# Patient Record
Sex: Female | Born: 1975 | Race: Black or African American | Hispanic: No | Marital: Single | Smoking: Never smoker
Health system: Southern US, Community
[De-identification: ages and names within clinical notes are randomized; demographics above are authoritative.]

---

## 2000-09-10 ENCOUNTER — Emergency Department (HOSPITAL_COMMUNITY): Admission: EM | Admit: 2000-09-10 | Discharge: 2000-09-10 | Payer: Self-pay | Admitting: *Deleted

## 2000-09-11 ENCOUNTER — Ambulatory Visit (HOSPITAL_COMMUNITY): Admission: RE | Admit: 2000-09-11 | Discharge: 2000-09-11 | Payer: Self-pay | Admitting: *Deleted

## 2000-09-11 ENCOUNTER — Encounter: Payer: Self-pay | Admitting: *Deleted

## 2001-08-07 ENCOUNTER — Ambulatory Visit (HOSPITAL_COMMUNITY): Admission: RE | Admit: 2001-08-07 | Discharge: 2001-08-07 | Payer: Self-pay | Admitting: Pulmonary Disease

## 2001-08-13 ENCOUNTER — Ambulatory Visit (HOSPITAL_COMMUNITY): Admission: RE | Admit: 2001-08-13 | Discharge: 2001-08-13 | Payer: Self-pay | Admitting: Pulmonary Disease

## 2001-10-18 ENCOUNTER — Emergency Department (HOSPITAL_COMMUNITY): Admission: EM | Admit: 2001-10-18 | Discharge: 2001-10-18 | Payer: Self-pay | Admitting: Emergency Medicine

## 2002-03-19 ENCOUNTER — Encounter: Payer: Self-pay | Admitting: Emergency Medicine

## 2002-03-19 ENCOUNTER — Emergency Department (HOSPITAL_COMMUNITY): Admission: EM | Admit: 2002-03-19 | Discharge: 2002-03-19 | Payer: Self-pay | Admitting: Emergency Medicine

## 2003-05-10 ENCOUNTER — Emergency Department (HOSPITAL_COMMUNITY): Admission: EM | Admit: 2003-05-10 | Discharge: 2003-05-10 | Payer: Self-pay | Admitting: Emergency Medicine

## 2005-08-30 ENCOUNTER — Emergency Department (HOSPITAL_COMMUNITY): Admission: EM | Admit: 2005-08-30 | Discharge: 2005-08-30 | Payer: Self-pay | Admitting: Emergency Medicine

## 2007-12-10 ENCOUNTER — Other Ambulatory Visit: Admission: RE | Admit: 2007-12-10 | Discharge: 2007-12-10 | Payer: Self-pay | Admitting: Obstetrics and Gynecology

## 2009-06-02 ENCOUNTER — Other Ambulatory Visit: Admission: RE | Admit: 2009-06-02 | Discharge: 2009-06-02 | Payer: Self-pay | Admitting: Obstetrics & Gynecology

## 2013-02-21 ENCOUNTER — Emergency Department (HOSPITAL_COMMUNITY)
Admission: EM | Admit: 2013-02-21 | Discharge: 2013-02-21 | Disposition: A | Discharge status: Home / Self Care | Payer: Self-pay | Attending: Emergency Medicine | Admitting: Emergency Medicine

## 2013-02-21 ENCOUNTER — Encounter (HOSPITAL_COMMUNITY): Payer: Self-pay | Admitting: Emergency Medicine

## 2013-02-21 DIAGNOSIS — X58XXXA Exposure to other specified factors, initial encounter: Secondary | ICD-10-CM | POA: Insufficient documentation

## 2013-02-21 DIAGNOSIS — Y939 Activity, unspecified: Secondary | ICD-10-CM | POA: Insufficient documentation

## 2013-02-21 DIAGNOSIS — S39012A Strain of muscle, fascia and tendon of lower back, initial encounter: Secondary | ICD-10-CM

## 2013-02-21 DIAGNOSIS — Y929 Unspecified place or not applicable: Secondary | ICD-10-CM | POA: Insufficient documentation

## 2013-02-21 DIAGNOSIS — S335XXA Sprain of ligaments of lumbar spine, initial encounter: Secondary | ICD-10-CM | POA: Insufficient documentation

## 2013-02-21 MED ORDER — OXYCODONE-ACETAMINOPHEN 5-325 MG PO TABS: 1.0000 | ORAL_TABLET | ORAL | Status: DC | PRN

## 2013-02-21 MED ORDER — KETOROLAC TROMETHAMINE 30 MG/ML IJ SOLN: 30.0000 mg | Freq: Once | INTRAMUSCULAR | Status: DC

## 2013-02-21 MED ORDER — HYDROMORPHONE HCL PF 1 MG/ML IJ SOLN
1.0000 mg | Freq: Once | INTRAMUSCULAR | Status: AC
  Administered 2013-02-21: 1 mg via INTRAMUSCULAR
  Filled 2013-02-21: qty 1

## 2013-02-21 MED ORDER — DIAZEPAM 5 MG PO TABS: 5.0000 mg | ORAL_TABLET | Freq: Three times a day (TID) | ORAL | Status: DC | PRN

## 2013-02-21 MED ORDER — NAPROXEN 375 MG PO TABS: 375.0000 mg | ORAL_TABLET | Freq: Two times a day (BID) | ORAL | Status: DC | PRN

## 2013-02-21 MED ORDER — KETOROLAC TROMETHAMINE 30 MG/ML IJ SOLN
30.0000 mg | Freq: Once | INTRAMUSCULAR | Status: AC
  Administered 2013-02-21: 30 mg via INTRAMUSCULAR
  Filled 2013-02-21: qty 1

## 2013-02-21 MED ORDER — DIAZEPAM 5 MG PO TABS
5.0000 mg | ORAL_TABLET | Freq: Once | ORAL | Status: AC
  Administered 2013-02-21: 5 mg via ORAL
  Filled 2013-02-21: qty 1

## 2013-02-21 NOTE — ED Provider Notes (Signed)
CSN: [id number]     Arrival date & time 02/21/13  4098 History   This chart was scribed for Raeford Razor, MD by Bennett Scrape, ED Scribe. This patient was seen in room APA03/APA03 and the patient's care was started at 7:45 AM.   Chief Complaint  Patient presents with  . Back Pain    The history is provided by the patient. No language interpreter was used.    HPI Comments: Brittney Dixon is a 37 y.o. female who presents to the Emergency Department complaining of constant, gradually worsening lower back pain described as a stiffness for the past week. The pain was originally located more in the center but shifted to the right side last night. She also admits that she intermittently experiences radiation into the right upper thigh. She reports that the pain is worse with ambulation but denies any difficulty walking. She states that she has tried several OTC pain relievers including BC powders and ibuprofen with no improvement. She denies any h/o back pain or prior back surgeries. She denies any extremity numbness or weakness, fevers or chills.    History reviewed. No pertinent past medical history. History reviewed. No pertinent past surgical history. History reviewed. No pertinent family history. History  Substance Use Topics  . Smoking status: Never Smoker   . Smokeless tobacco: Not on file  . Alcohol Use: No   No OB history provided.  Review of Systems  Constitutional: Negative for fever and chills.  Musculoskeletal: Positive for back pain. Negative for gait problem.  Neurological: Negative for weakness and numbness.  All other systems reviewed and are negative.    Allergies  Review of patient's allergies indicates not on file.  Home Medications  No current outpatient prescriptions on file.  Triage Vitals: BP 121/90  Pulse 92  Temp(Src) 98.5 F (36.9 C) (Oral)  Ht 5\' 2"  (1.575 m)  Wt 234 lb (106.142 kg)  BMI 42.79 kg/m2  SpO2 100%  LMP 02/22/2012  Physical Exam   Nursing note and vitals reviewed. Constitutional: She is oriented to person, place, and time. She appears well-developed and well-nourished. No distress.  HENT:  Head: Normocephalic and atraumatic.  Eyes: EOM are normal.  Neck: Normal range of motion.  Cardiovascular: Normal rate, regular rhythm and normal heart sounds.   Pulmonary/Chest: Effort normal and breath sounds normal.  Abdominal: Soft. She exhibits no distension. There is no tenderness.  Musculoskeletal: Normal range of motion.  Tenderness in the lumbar midline region and right paraspinal muscles  Neurological: She is alert and oriented to person, place, and time.  Normal strength to BLE  Skin: Skin is warm and dry.  Psychiatric: She has a normal mood and affect. Judgment normal.    ED Course  Procedures (including critical care time)  Medications  HYDROmorphone (DILAUDID) injection 1 mg (1 mg Intramuscular Given 02/21/13 0803)  diazepam (VALIUM) tablet 5 mg (5 mg Oral Given 02/21/13 0803)  ketorolac (TORADOL) 30 MG/ML injection 30 mg (30 mg Intramuscular Given 02/21/13 0803)     DIAGNOSTIC STUDIES: Oxygen Saturation is 100% on room air, normal by my interpretation.    COORDINATION OF CARE: 7:50 AM-Discussed treatment plan which includes pain medication and prescriptions  with pt at bedside and pt agreed to plan. Pt reports that she has a ride home.  Labs Review Labs Reviewed - No data to display Imaging Review No results found.  EKG Interpretation   None       MDM   1. Lumbar strain, initial  encounter    37 year old female with atraumatic back pain. Nonfocal neurological examination. No concerning historical or exam findings. Imaging likely low yield. We'll tx symptomatically at this time for likely lumbosacral strain. Return precautions discussed.  I personally preformed the services scribed in my presence. The recorded information has been reviewed is accurate. Raeford Razor, MD.    Raeford Razor,  MD 02/21/13 [phone]

## 2013-02-21 NOTE — ED Notes (Signed)
Pt reports back pain x1 week with increase in pain yesterday. Pt reports taking OTC meds yesterday for pain with no relief. Pt denies any numbness or tingling in extremities. Pt reports pain is worse with movement. Pt denies any injury.Pt alert and oriented. nad noted.

## 2017-03-31 ENCOUNTER — Emergency Department (HOSPITAL_COMMUNITY): Payer: No Typology Code available for payment source

## 2017-03-31 ENCOUNTER — Emergency Department (HOSPITAL_COMMUNITY)
Admission: EM | Admit: 2017-03-31 | Discharge: 2017-03-31 | Disposition: A | Discharge status: Home / Self Care | Payer: No Typology Code available for payment source | Attending: Emergency Medicine | Admitting: Emergency Medicine

## 2017-03-31 ENCOUNTER — Encounter (HOSPITAL_COMMUNITY): Payer: Self-pay | Admitting: Emergency Medicine

## 2017-03-31 ENCOUNTER — Other Ambulatory Visit: Payer: Self-pay

## 2017-03-31 DIAGNOSIS — Z79899 Other long term (current) drug therapy: Secondary | ICD-10-CM | POA: Insufficient documentation

## 2017-03-31 DIAGNOSIS — M542 Cervicalgia: Secondary | ICD-10-CM | POA: Diagnosis present

## 2017-03-31 DIAGNOSIS — M47812 Spondylosis without myelopathy or radiculopathy, cervical region: Secondary | ICD-10-CM | POA: Insufficient documentation

## 2017-03-31 DIAGNOSIS — Z041 Encounter for examination and observation following transport accident: Secondary | ICD-10-CM | POA: Diagnosis not present

## 2017-03-31 MED ORDER — IBUPROFEN 800 MG PO TABS
800.0000 mg | ORAL_TABLET | Freq: Once | ORAL | Status: AC
  Administered 2017-03-31: 800 mg via ORAL
  Filled 2017-03-31: qty 1

## 2017-03-31 MED ORDER — ACETAMINOPHEN 325 MG PO TABS
650.0000 mg | ORAL_TABLET | Freq: Once | ORAL | Status: AC
  Administered 2017-03-31: 650 mg via ORAL
  Filled 2017-03-31: qty 2

## 2017-03-31 MED ORDER — CYCLOBENZAPRINE HCL 10 MG PO TABS: 10.0000 mg | ORAL_TABLET | Freq: Three times a day (TID) | ORAL | 0 refills | Status: DC

## 2017-03-31 MED ORDER — IBUPROFEN 600 MG PO TABS: 600.0000 mg | ORAL_TABLET | Freq: Four times a day (QID) | ORAL | 0 refills | Status: DC
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
## 2017-03-31 NOTE — ED Notes (Signed)
Pt questions what she will need for insurance N replies that insurance will tell her what they require   She continues have relaxed facial features, watching TV

## 2017-03-31 NOTE — ED Notes (Signed)
Awaiting rad report 

## 2017-03-31 NOTE — Discharge Instructions (Signed)
Your examination is consistent with muscle strain involving the neck and shoulder area as well as the lower back area.  The x-ray of your cervical spine is negative for fracture or dislocation.  There are some significant arthritis changes present.  Particularly C3-C4 and C6-C7.  Please have your primary doctor or the orthopedist of your choice follow-up on the arthritis changes with some spurs present.  Warm tub soaks to your neck/shoulders and lower back will be helpful.  Please use Flexeril 3 times a day as needed for spasm pain.  Use ibuprofen with breakfast, lunch, dinner, and at bedtime.

## 2017-03-31 NOTE — ED Notes (Signed)
In rad 
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
## 2017-03-31 NOTE — ED Notes (Signed)
From rad 

## 2017-03-31 NOTE — ED Notes (Signed)
HB in to assess 

## 2018-10-18 DIAGNOSIS — G5600 Carpal tunnel syndrome, unspecified upper limb: Secondary | ICD-10-CM | POA: Diagnosis not present

## 2018-10-18 DIAGNOSIS — F411 Generalized anxiety disorder: Secondary | ICD-10-CM | POA: Diagnosis not present

## 2018-10-24 ENCOUNTER — Other Ambulatory Visit (HOSPITAL_COMMUNITY): Payer: Self-pay | Admitting: Internal Medicine

## 2018-10-24 DIAGNOSIS — G5600 Carpal tunnel syndrome, unspecified upper limb: Secondary | ICD-10-CM | POA: Diagnosis not present

## 2018-10-24 DIAGNOSIS — Z1231 Encounter for screening mammogram for malignant neoplasm of breast: Secondary | ICD-10-CM

## 2018-10-24 DIAGNOSIS — Z0001 Encounter for general adult medical examination with abnormal findings: Secondary | ICD-10-CM | POA: Diagnosis not present

## 2018-10-24 DIAGNOSIS — F411 Generalized anxiety disorder: Secondary | ICD-10-CM | POA: Diagnosis not present

## 2018-10-28 ENCOUNTER — Inpatient Hospital Stay (HOSPITAL_COMMUNITY): Admission: RE | Admit: 2018-10-28 | Payer: Self-pay | Source: Ambulatory Visit

## 2018-11-06 ENCOUNTER — Ambulatory Visit (HOSPITAL_COMMUNITY)
Admission: RE | Admit: 2018-11-06 | Discharge: 2018-11-06 | Disposition: A | Discharge status: Home / Self Care | Payer: BC Managed Care – PPO | Source: Ambulatory Visit | Attending: Internal Medicine | Admitting: Internal Medicine

## 2018-11-06 ENCOUNTER — Other Ambulatory Visit: Payer: Self-pay

## 2018-11-06 DIAGNOSIS — Z1231 Encounter for screening mammogram for malignant neoplasm of breast: Secondary | ICD-10-CM | POA: Insufficient documentation

## 2018-11-11 ENCOUNTER — Other Ambulatory Visit (HOSPITAL_COMMUNITY): Payer: Self-pay | Admitting: Internal Medicine

## 2018-11-11 DIAGNOSIS — R928 Other abnormal and inconclusive findings on diagnostic imaging of breast: Secondary | ICD-10-CM

## 2018-11-26 ENCOUNTER — Ambulatory Visit (HOSPITAL_COMMUNITY): Payer: BC Managed Care – PPO

## 2018-11-26 ENCOUNTER — Encounter (HOSPITAL_COMMUNITY): Payer: BC Managed Care – PPO

## 2018-12-10 ENCOUNTER — Ambulatory Visit (HOSPITAL_COMMUNITY)
Admission: RE | Admit: 2018-12-10 | Discharge: 2018-12-10 | Disposition: A | Discharge status: Home / Self Care | Payer: BC Managed Care – PPO | Source: Ambulatory Visit | Attending: Internal Medicine | Admitting: Internal Medicine

## 2018-12-10 ENCOUNTER — Other Ambulatory Visit: Payer: Self-pay

## 2018-12-10 DIAGNOSIS — R928 Other abnormal and inconclusive findings on diagnostic imaging of breast: Secondary | ICD-10-CM | POA: Diagnosis not present

## 2018-12-10 DIAGNOSIS — R922 Inconclusive mammogram: Secondary | ICD-10-CM | POA: Diagnosis not present

## 2018-12-10 DIAGNOSIS — N6489 Other specified disorders of breast: Secondary | ICD-10-CM | POA: Diagnosis not present

## 2019-02-14 ENCOUNTER — Telehealth: Payer: Self-pay | Admitting: Adult Health

## 2019-02-14 NOTE — Telephone Encounter (Signed)

## 2019-02-17 ENCOUNTER — Other Ambulatory Visit: Payer: BC Managed Care – PPO | Admitting: Adult Health

## 2019-09-02 ENCOUNTER — Ambulatory Visit
Admission: EM | Admit: 2019-09-02 | Discharge: 2019-09-02 | Disposition: A | Discharge status: Home / Self Care | Payer: BC Managed Care – PPO

## 2019-09-02 ENCOUNTER — Emergency Department (HOSPITAL_COMMUNITY)
Admission: EM | Admit: 2019-09-02 | Discharge: 2019-09-02 | Disposition: A | Discharge status: Home / Self Care | Payer: BC Managed Care – PPO

## 2019-09-02 ENCOUNTER — Other Ambulatory Visit: Payer: Self-pay

## 2019-09-02 DIAGNOSIS — H6123 Impacted cerumen, bilateral: Secondary | ICD-10-CM | POA: Diagnosis not present

## 2019-09-02 NOTE — ED Provider Notes (Signed)
[facility]   [id number] 09/02/19 Arrival Time: 1600  CC: Changes in hearing  SUBJECTIVE: History from: patient.  Brittney Dixon is a 43 y.o. female who presents with decreased hearing in left ear x few days.  Speculates the pollen may have caused symptoms.  Patient has tried OTC ear wax drops without relief.  Denies similar symptoms in the past.    Denies fever, chills, fatigue, cough, SOB, wheezing, chest pain, nausea, changes in bowel or bladder habits.    ROS: As per HPI.  All other pertinent ROS negative.     History reviewed. No pertinent past medical history. Past Surgical History:  Procedure Laterality Date  . CESAREAN SECTION     No Known Allergies No current facility-administered medications on file prior to encounter.   No current outpatient medications on file prior to encounter.   Social History   Socioeconomic History  . Marital status: Single    Spouse name: Not on file  . Number of children: Not on file  . Years of education: Not on file  . Highest education level: Not on file  Occupational History  . Not on file  Tobacco Use  . Smoking status: Never Smoker  . Smokeless tobacco: Never Used  Substance and Sexual Activity  . Alcohol use: No  . Drug use: No    Types: Marijuana    Comment: last use 02/21/13  . Sexual activity: Yes    Birth control/protection: Injection  Other Topics Concern  . Not on file  Social History Narrative  . Not on file   Social Determinants of Health   Financial Resource Strain:   . Difficulty of Paying Living Expenses:   Food Insecurity:   . Worried About Charity fundraiser in the Last Year:   . Arboriculturist in the Last Year:   Transportation Needs:   . Film/video editor (Medical):   Marland Kitchen Lack of Transportation (Non-Medical):   Physical Activity:   . Days of Exercise per Week:   . Minutes of Exercise per Session:   Stress:   . Feeling of Stress :   Social Connections:   . Frequency of  Communication with Friends and Family:   . Frequency of Social Gatherings with Friends and Family:   . Attends Religious Services:   . Active Member of Clubs or Organizations:   . Attends Archivist Meetings:   Marland Kitchen Marital Status:   Intimate Partner Violence:   . Fear of Current or Ex-Partner:   . Emotionally Abused:   Marland Kitchen Physically Abused:   . Sexually Abused:    History reviewed. No pertinent family history.  OBJECTIVE:  Vitals:   09/02/19 1605  BP: (!) 145/96  Pulse: 98  Resp: 16  Temp: (!) 97 F (36.1 C)  SpO2: 96%     General appearance: alert; well-appearing, nontoxic; speaking in full sentences and tolerating own secretions HEENT: NCAT; Ears: EACs clear, TMs pearly gray; Eyes: PERRL.  EOM grossly intact.Nose: nares patent without rhinorrhea, Throat: oropharynx clear, tonsils non erythematous or enlarged, uvula midline  Neck: supple without LAD Lungs: unlabored respirations, symmetrical air entry; cough: absent; no respiratory distress; CTAB Heart: regular rate and rhythm.  Skin: warm and dry Psychological: alert and cooperative; normal mood and affect    PROCEDURE:  Consent granted.  Bilateral ear lavage performed by RN.  TMs visualized.  PT tolerated procedure well.     ASSESSMENT & PLAN:  1. Bilateral impacted cerumen  Rest and drink plenty of fluids Ear lavage performed Follow up with PCP if symptoms persists Return here or go to the ER if you have any new or worsening symptoms fever, chills, nausea, vomiting, discharge, ear pain, etc...  Reviewed expectations re: course of current medical issues. Questions answered. Outlined signs and symptoms indicating need for more acute intervention. Patient verbalized understanding. After Visit Summary given.         Rennis Harding, PA-C 09/02/19 [phone]

## 2019-09-02 NOTE — ED Triage Notes (Signed)
Pt c/o L ear hearing difficulty

## 2019-09-02 NOTE — ED Triage Notes (Signed)
Pt did not want to wait, plan to go to Urgent Care.

## 2019-09-02 NOTE — Discharge Instructions (Signed)
Rest and drink plenty of fluids Ear lavage performed Follow up with PCP if symptoms persists Return here or go to the ER if you have any new or worsening symptoms fever, chills, nausea, vomiting, discharge, ear pain, etc..Marland Kitchen

## 2019-10-01 DIAGNOSIS — Z0001 Encounter for general adult medical examination with abnormal findings: Secondary | ICD-10-CM | POA: Diagnosis not present

## 2019-10-20 ENCOUNTER — Other Ambulatory Visit (HOSPITAL_COMMUNITY): Payer: Self-pay | Admitting: Internal Medicine

## 2019-10-20 DIAGNOSIS — N6489 Other specified disorders of breast: Secondary | ICD-10-CM

## 2019-11-11 ENCOUNTER — Encounter (HOSPITAL_COMMUNITY): Payer: BC Managed Care – PPO

## 2019-11-11 ENCOUNTER — Other Ambulatory Visit (HOSPITAL_COMMUNITY): Payer: BC Managed Care – PPO

## 2019-11-13 ENCOUNTER — Inpatient Hospital Stay (HOSPITAL_COMMUNITY): Admission: RE | Admit: 2019-11-13 | Payer: BC Managed Care – PPO | Source: Ambulatory Visit

## 2019-11-13 ENCOUNTER — Ambulatory Visit (HOSPITAL_COMMUNITY): Admission: RE | Admit: 2019-11-13 | Payer: BC Managed Care – PPO | Source: Ambulatory Visit

## 2019-11-13 ENCOUNTER — Encounter (HOSPITAL_COMMUNITY): Payer: Self-pay

## 2020-02-03 IMAGING — US US BREAST*R* LIMITED INC AXILLA
1 series · 10 of 10 positions shown · non-contrast
Comparison: Baseline screening mammogram 11/06/2018

CLINICAL DATA: Screening recall from new baseline to evaluate an
asymmetry in the right breast.

EXAM:
DIGITAL DIAGNOSTIC RIGHT MAMMOGRAM WITH CAD AND TOMO
ULTRASOUND RIGHT BREAST

[Series 1: us breast*right* limited inc axilla · 0.07mm/px · 10 of 10 slices shown]
[im 1/10]
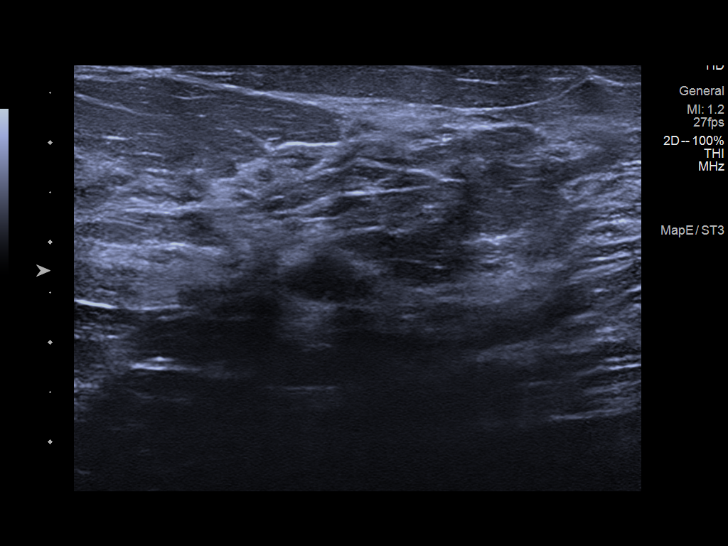
[im 2/10]
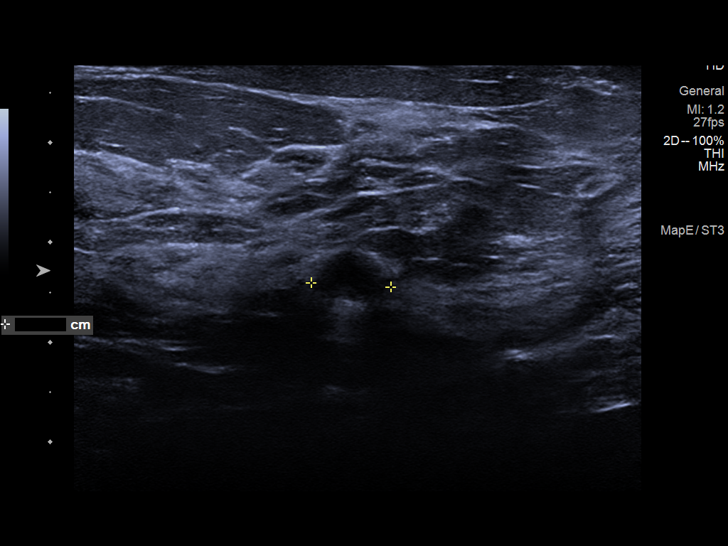
[im 3/10]
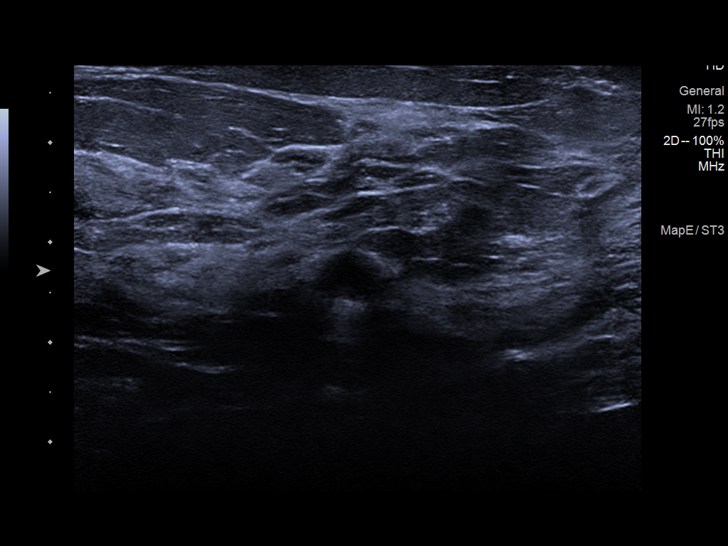
[im 4/10]
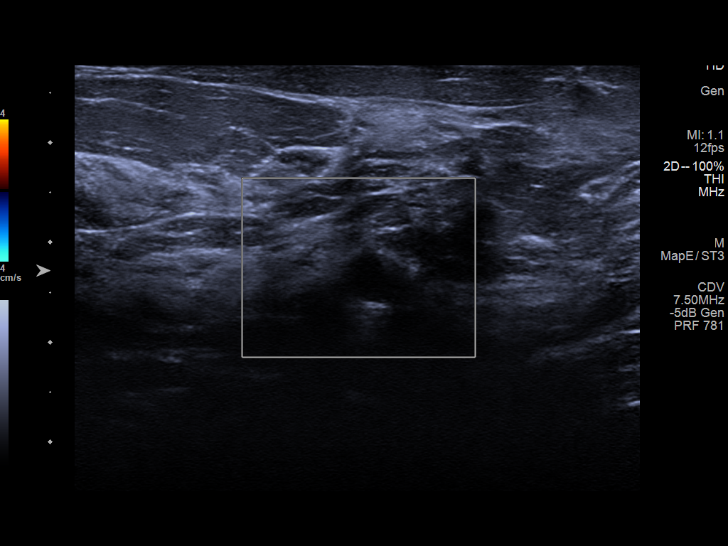
[im 5/10]
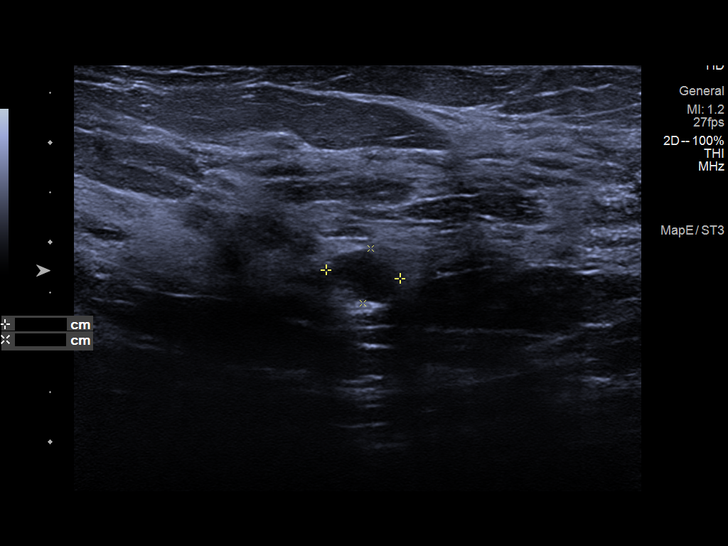
[im 6/10]
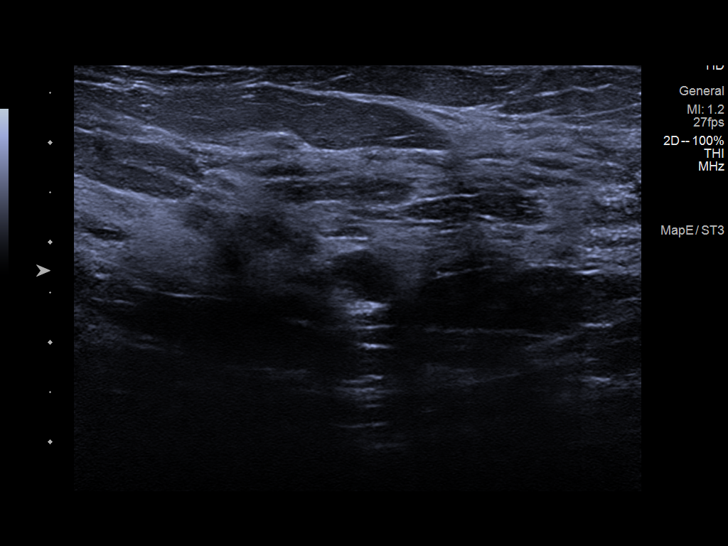
[im 7/10]
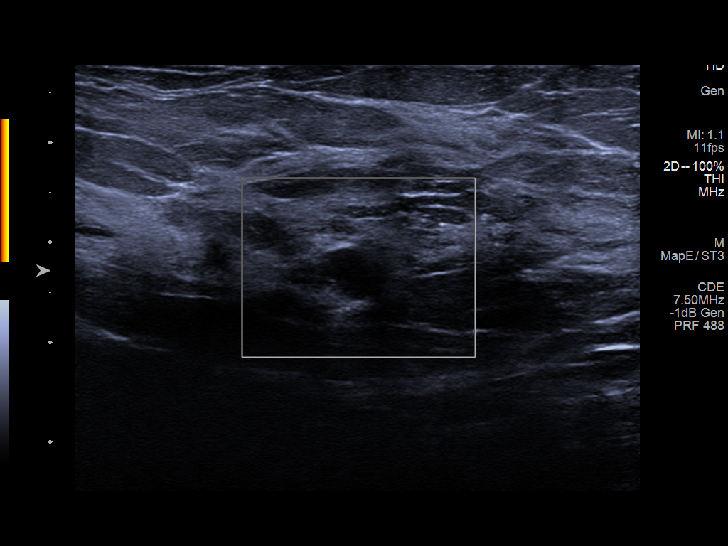
[im 8/10]
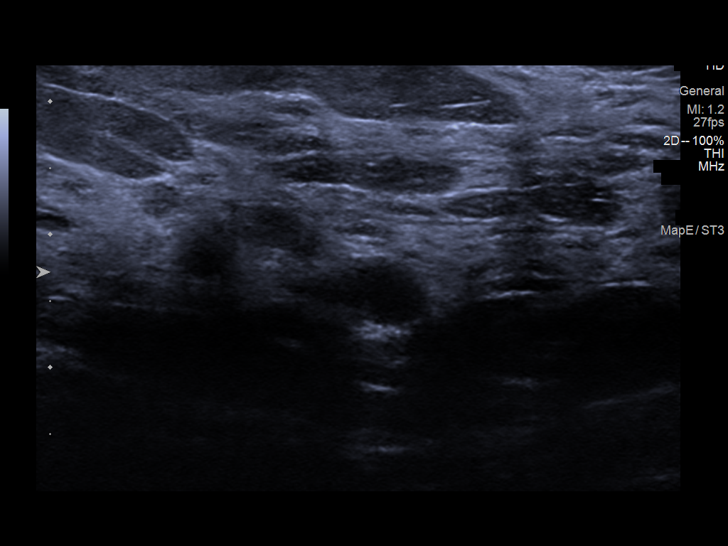
[im 9/10]
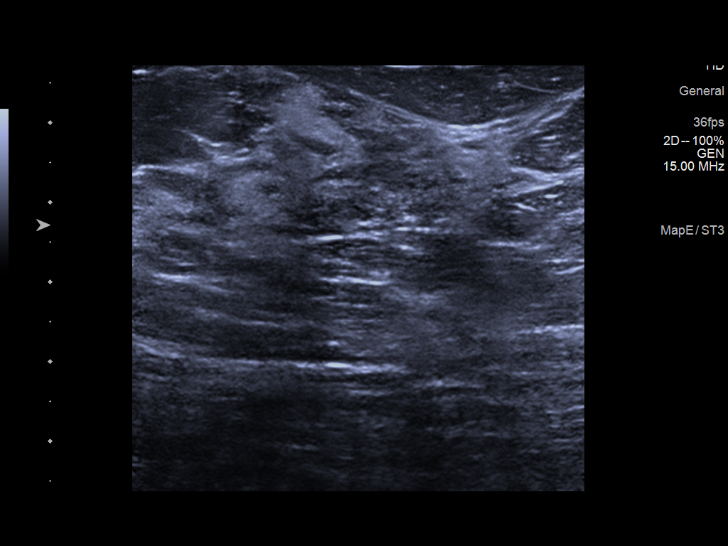
[im 10/10]
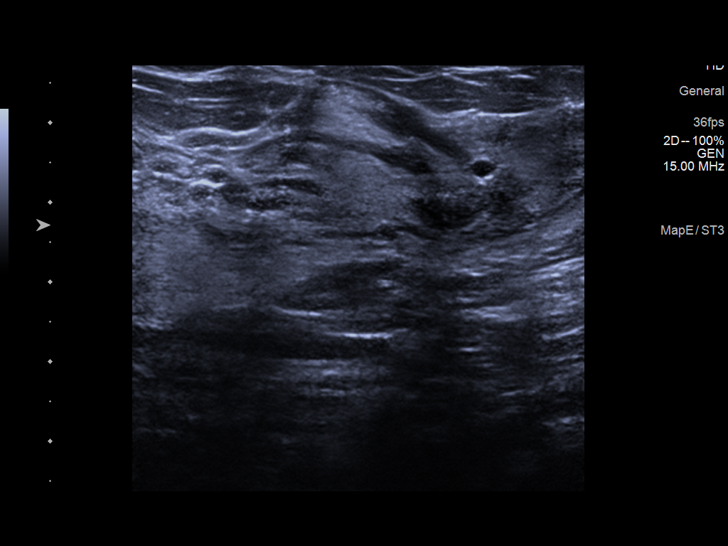

[10 of 10 positions shown; findings below may reference images not displayed]

ACR Breast Density Category c: The breast tissue is heterogeneously
dense, which may obscure small masses.
FINDINGS: Spot compression tomosynthesis images reveal a persistent asymmetry
through the upper outer anterior right breast. No suspicious
underlying masses or areas of distortion are identified.

Mammographic images were processed with CAD.

On physical exam, no suspicious palpable masses are identified in
the upper-outer right breast.

Targeted ultrasound is performed, showing normal fibroglandular
tissue in the upper-outer anterior right breast. Incidentally noted
are a few scattered benign-appearing cysts.
IMPRESSION: There is a probably benign asymmetry in the upper outer anterior
right breast.

RECOMMENDATION:
Six-month follow-up diagnostic right breast mammogram.

I have discussed the findings and recommendations with the patient.
If applicable, a reminder letter will be sent to the patient
regarding the next appointment.

BI-RADS CATEGORY  3: Probably benign.

## 2020-08-26 DIAGNOSIS — F411 Generalized anxiety disorder: Secondary | ICD-10-CM | POA: Diagnosis not present

## 2020-08-26 DIAGNOSIS — K219 Gastro-esophageal reflux disease without esophagitis: Secondary | ICD-10-CM | POA: Diagnosis not present

## 2020-10-04 NOTE — [facility] Nurse Program (Signed)
Blood pressure screening at [employer] [employer] Client with insured and sees Dr. Felecia Shelling currently No history per patient of hypertension  Reading today 125/84 pulse 75 today.  Follow with dr per his directions.  Francee Nodal RN [facility] program

## 2021-05-06 ENCOUNTER — Other Ambulatory Visit: Payer: Self-pay

## 2021-05-06 ENCOUNTER — Emergency Department (HOSPITAL_COMMUNITY)
Admission: EM | Admit: 2021-05-06 | Discharge: 2021-05-06 | Disposition: A | Discharge status: Home / Self Care | Payer: BC Managed Care – PPO | Attending: Emergency Medicine | Admitting: Emergency Medicine

## 2021-05-06 ENCOUNTER — Emergency Department (HOSPITAL_COMMUNITY): Payer: BC Managed Care – PPO

## 2021-05-06 ENCOUNTER — Encounter (HOSPITAL_COMMUNITY): Payer: Self-pay

## 2021-05-06 DIAGNOSIS — M79672 Pain in left foot: Secondary | ICD-10-CM | POA: Diagnosis not present

## 2021-05-06 MED ORDER — MELOXICAM 7.5 MG PO TABS
15.0000 mg | ORAL_TABLET | Freq: Once | ORAL | Status: DC
  Filled 2021-05-06: qty 2

## 2021-05-06 MED ORDER — MELOXICAM 7.5 MG PO TABS: 7.5000 mg | ORAL_TABLET | Freq: Two times a day (BID) | ORAL | 0 refills | Status: AC | PRN

## 2021-05-06 NOTE — Discharge Instructions (Signed)
Your x-ray shows some arthritis, no broken bones  Please take Mobic - 7.5mg  by mouth twice daily as needed for pain - this in an antiinflammatory medicine (NSAID) and is similar to ibuprofen - many people feel that it is stronger than ibuprofen and it is easier to take since it is a smaller pill.  Please use this only for 1 week - if your pain persists, you will need to follow up with your doctor in the office for ongoing guidance and pain control.

## 2021-05-06 NOTE — ED Provider Notes (Signed)
[facility] EMERGENCY DEPARTMENT Provider Note   CSN: [id number] Arrival date & time: 05/06/21  1549     History  Chief Complaint  Patient presents with   Foot Pain    Brittney Dixon is a 46 y.o. female.   Foot Pain   This patient is a 46 year old female, she has no history of diabetes, she complains of left foot pain which has been going on for some time, possibly for several years but getting worse recently.  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  No fevers no chills and no specific injuries.  Home Medications Prior to Admission medications   Medication Sig Start Date End Date Taking? Authorizing Provider  meloxicam (MOBIC) 7.5 MG tablet Take 1 tablet (7.5 mg total) by mouth 2 (two) times daily as needed for up to 14 days for pain. 05/06/21 05/20/21 Yes Eber Hong, MD      Allergies    Patient has no known allergies.    Review of Systems   Review of Systems  Constitutional:  Negative for fever.  Musculoskeletal:  Positive for gait problem. Negative for arthralgias and joint swelling.   Physical Exam Updated Vital Signs BP (!) 141/94 (BP Location: Right Arm)    Pulse 89    Temp 98.4 F (36.9 C)    Resp (!) 21    LMP 10/06/2018    SpO2 100%  Physical Exam Vitals and nursing note reviewed.  Constitutional:      Appearance: She is well-developed. She is not diaphoretic.  HENT:     Head: Normocephalic and atraumatic.  Eyes:     General:        Right eye: No discharge.        Left eye: No discharge.     Conjunctiva/sclera: Conjunctivae normal.  Pulmonary:     Effort: Pulmonary effort is normal. No respiratory distress.  Musculoskeletal:        General: Tenderness present.     Right lower leg: No edema.     Left lower leg: No edema.     Comments: Mild tenderness over the base of the fifth metatarsal of the left foot, no deformity, no redness, no open wounds, ambulates without  Skin:    General: Skin is warm and dry.     Findings: No  erythema or rash.  Neurological:     Mental Status: She is alert.     Coordination: Coordination normal.    ED Results / Procedures / Treatments   Labs (all labs ordered are listed, but only abnormal results are displayed) Labs Reviewed - No data to display  EKG None  Radiology DG Foot Complete Left  Result Date: 05/06/2021 CLINICAL DATA:  Left foot pain.  Lateral pain for 2-3 years. EXAM: LEFT FOOT - COMPLETE 3+ VIEW COMPARISON:  None. FINDINGS: Mild pes planus. Mild dorsal talonavicular degenerative osteophytosis and joint space narrowing. Moderate calcaneal heel spur. Minimal chronic enthesopathic change at the Achilles insertion on the calcaneus. Minimal lateral great toe metatarsal head degenerative spurring. Minimal hallux valgus. No acute fracture or dislocation. IMPRESSION:: IMPRESSION: 1. Moderate calcaneal heel spur. 2. Mild talonavicular osteoarthritis. 3. Mild pes planus. Electronically Signed   By: Neita Garnet M.D.   On: 05/06/2021 17:09    Procedures Procedures    Medications Ordered in ED Medications  meloxicam (MOBIC) tablet 15 mg (has no administration in time range)    ED Course/ Medical Decision Making/ A&P  Medical Decision Making Amount and/or Complexity of Data Reviewed Radiology: ordered.   I personally viewed and interpreted the x-ray, no signs of fracture or deformity, no signs of rashes, no open wounds.  I agree with the radiologist interpretation.  High BMI, likely contributory - on feet all day long at work.  Patient will be given an anti-inflammatory, discharged home, she is agreeable  L:ikely arthritis - chronic pain.        Final Clinical Impression(s) / ED Diagnoses Final diagnoses:  Foot pain, left    Rx / DC Orders ED Discharge Orders          Ordered    meloxicam (MOBIC) 7.5 MG tablet  2 times daily PRN        05/06/21 1849              Eber Hong, MD 05/06/21 [phone]

## 2021-05-06 NOTE — ED Triage Notes (Signed)
Left lateral foot pain x several years, worse today. No known injury. 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 No obvious deformity. Pt is able to bear weight on that foot.

## 2021-11-03 ENCOUNTER — Other Ambulatory Visit (HOSPITAL_COMMUNITY)
Admission: RE | Admit: 2021-11-03 | Discharge: 2021-11-03 | Disposition: A | Discharge status: Home / Self Care | Payer: Managed Care, Other (non HMO) | Source: Ambulatory Visit | Attending: Internal Medicine | Admitting: Internal Medicine

## 2021-11-03 DIAGNOSIS — Z0001 Encounter for general adult medical examination with abnormal findings: Secondary | ICD-10-CM | POA: Diagnosis present

## 2021-11-03 DIAGNOSIS — E039 Hypothyroidism, unspecified: Secondary | ICD-10-CM | POA: Diagnosis not present

## 2021-11-03 DIAGNOSIS — E559 Vitamin D deficiency, unspecified: Secondary | ICD-10-CM | POA: Diagnosis not present

## 2021-11-03 LAB — HEPATIC FUNCTION PANEL
ALT: 17 U/L (ref 0–44)
AST: 21 U/L (ref 15–41)
Albumin: 4 g/dL (ref 3.5–5.0)
Alkaline Phosphatase: 77 U/L (ref 38–126)
Bilirubin, Direct: 0.1 mg/dL (ref 0.0–0.2)
Indirect Bilirubin: 0.5 mg/dL (ref 0.3–0.9)
Total Bilirubin: 0.6 mg/dL (ref 0.3–1.2)
Total Protein: 7.4 g/dL (ref 6.5–8.1)

## 2021-11-03 LAB — BASIC METABOLIC PANEL
Anion gap: 8 (ref 5–15)
BUN: 15 mg/dL (ref 6–20)
CO2: 23 mmol/L (ref 22–32)
Calcium: 8.6 mg/dL — ABNORMAL LOW (ref 8.9–10.3)
Chloride: 106 mmol/L (ref 98–111)
Creatinine, Ser: 1.05 mg/dL — ABNORMAL HIGH (ref 0.44–1.00)
GFR, Estimated: >60 mL/min (ref >60)
Glucose, Bld: 94 mg/dL (ref 70–99)
Potassium: 3.4 mmol/L — ABNORMAL LOW (ref 3.5–5.1)
Sodium: 137 mmol/L (ref 135–145)

## 2021-11-03 LAB — CBC
HCT: 39.6 % (ref 36.0–46.0)
Hemoglobin: 12.6 g/dL (ref 12.0–15.0)
MCH: 26.2 pg (ref 26.0–34.0)
MCHC: 31.8 g/dL (ref 30.0–36.0)
MCV: 82.3 fL (ref 80.0–100.0)
Platelets: 169 10*3/uL (ref 150–400)
RBC: 4.81 MIL/uL (ref 3.87–5.11)
RDW: 13.4 % (ref 11.5–15.5)
WBC: 5.8 10*3/uL (ref 4.0–10.5)
nRBC: 0 % (ref 0.0–0.2)

## 2021-11-03 LAB — LIPID PANEL
Cholesterol: 190 mg/dL (ref 0–200)
HDL: 67 mg/dL (ref >40)
LDL Cholesterol: 102 mg/dL — ABNORMAL HIGH (ref 0–99)
Total CHOL/HDL Ratio: 2.8 RATIO
Triglycerides: 103 mg/dL (ref <150)
VLDL: 21 mg/dL (ref 0–40)

## 2021-11-03 LAB — VITAMIN D 25 HYDROXY (VIT D DEFICIENCY, FRACTURES): Vit D, 25-Hydroxy: 17.85 ng/mL — ABNORMAL LOW (ref 30–100)

## 2021-11-03 LAB — TSH: TSH: 0.557 u[IU]/mL (ref 0.350–4.500)

## 2021-11-04 LAB — T4: T4, Total: 7.6 ug/dL (ref 4.5–12.0)

## 2021-12-25 ENCOUNTER — Other Ambulatory Visit: Payer: Self-pay

## 2021-12-25 ENCOUNTER — Emergency Department (HOSPITAL_COMMUNITY)
Admission: EM | Admit: 2021-12-25 | Discharge: 2021-12-25 | Disposition: A | Discharge status: Home / Self Care | Payer: Managed Care, Other (non HMO) | Attending: Emergency Medicine | Admitting: Emergency Medicine

## 2021-12-25 ENCOUNTER — Encounter (HOSPITAL_COMMUNITY): Payer: Self-pay | Admitting: *Deleted

## 2021-12-25 DIAGNOSIS — Z20822 Contact with and (suspected) exposure to covid-19: Secondary | ICD-10-CM

## 2021-12-25 DIAGNOSIS — Z1152 Encounter for screening for COVID-19: Secondary | ICD-10-CM | POA: Insufficient documentation

## 2021-12-25 LAB — SARS CORONAVIRUS 2 BY RT PCR: SARS Coronavirus 2 by RT PCR: NEGATIVE

## 2021-12-25 NOTE — Discharge Instructions (Addendum)
Your covid test was negative. Please consider having FREE covid tests sent to your home by going to the website: https://www.covid.gov/tests 
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
## 2021-12-25 NOTE — ED Provider Notes (Addendum)
  [facility] EMERGENCY DEPARTMENT Provider Note   CSN: [id number] Arrival date & time: 12/25/21  1540     History  Chief Complaint  Patient presents with   wants covid test    Brittney Dixon is a 46 y.o. femalewho presents for asymptomatic exposure to COVID in the household.  pt wants to get tested.  Pt has no other complaints.  HPI     Home Medications Prior to Admission medications   Not on File      Allergies    Patient has no known allergies.    Review of Systems   Review of Systems  Physical Exam Updated Vital Signs BP (!) 150/64   Pulse 83   Temp 97.9 F (36.6 C) (Oral)   Resp 18   Ht 5\' 2"  (1.575 m)   Wt 104.3 kg   LMP 10/06/2018   SpO2 99%   BMI 42.07 kg/m  Physical Exam Vitals and nursing note reviewed.  Constitutional:      General: She is not in acute distress.    Appearance: She is well-developed. She is not diaphoretic.  HENT:     Head: Normocephalic and atraumatic.     Right Ear: External ear normal.     Left Ear: External ear normal.     Nose: Nose normal.     Mouth/Throat:     Mouth: Mucous membranes are moist.  Eyes:     General: No scleral icterus.    Conjunctiva/sclera: Conjunctivae normal.  Cardiovascular:     Rate and Rhythm: Normal rate and regular rhythm.     Heart sounds: Normal heart sounds. No murmur heard.    No friction rub. No gallop.  Pulmonary:     Effort: Pulmonary effort is normal. No respiratory distress.     Breath sounds: Normal breath sounds.  Abdominal:     General: Bowel sounds are normal. There is no distension.     Palpations: Abdomen is soft. There is no mass.     Tenderness: There is no abdominal tenderness. There is no guarding.  Musculoskeletal:     Cervical back: Normal range of motion.  Skin:    General: Skin is warm and dry.  Neurological:     Mental Status: She is alert and oriented to person, place, and time.  Psychiatric:        Behavior: Behavior normal.     ED Results / Procedures /  Treatments   Labs (all labs ordered are listed, but only abnormal results are displayed) Labs Reviewed  SARS CORONAVIRUS 2 BY RT PCR    EKG None  Radiology No results found.  Procedures Procedures    Medications Ordered in ED Medications - No data to display  ED Course/ Medical Decision Making/ A&P                           Medical Decision Making  Pt here for covid exposure- asymptomatic. Covid test negative . Discharge with return precautions.        Final Clinical Impression(s) / ED Diagnoses Final diagnoses:  None    Rx / DC Orders ED Discharge Orders     None         Margarita Mail, PA-C 12/25/21 1342    Margarita Mail, PA-C 12/25/21 1432    Elgie Congo, MD 12/25/21 [phone]

## 2022-05-22 ENCOUNTER — Other Ambulatory Visit (HOSPITAL_COMMUNITY)
Admission: RE | Admit: 2022-05-22 | Discharge: 2022-05-22 | Disposition: A | Discharge status: Home / Self Care | Payer: Managed Care, Other (non HMO) | Source: Ambulatory Visit | Attending: Internal Medicine | Admitting: Internal Medicine

## 2022-05-22 DIAGNOSIS — Z0001 Encounter for general adult medical examination with abnormal findings: Secondary | ICD-10-CM | POA: Diagnosis present

## 2022-05-22 DIAGNOSIS — E559 Vitamin D deficiency, unspecified: Secondary | ICD-10-CM | POA: Insufficient documentation

## 2022-05-22 LAB — HEPATIC FUNCTION PANEL
ALT: 14 U/L (ref 0–44)
AST: 17 U/L (ref 15–41)
Albumin: 3.7 g/dL (ref 3.5–5.0)
Alkaline Phosphatase: 75 U/L (ref 38–126)
Bilirubin, Direct: 0.1 mg/dL (ref 0.0–0.2)
Indirect Bilirubin: 0.4 mg/dL (ref 0.3–0.9)
Total Bilirubin: 0.5 mg/dL (ref 0.3–1.2)
Total Protein: 7.1 g/dL (ref 6.5–8.1)

## 2022-05-22 LAB — CBC WITH DIFFERENTIAL/PLATELET
Abs Immature Granulocytes: 0.01 10*3/uL (ref 0.00–0.07)
Basophils Absolute: 0 10*3/uL (ref 0.0–0.1)
Basophils Relative: 0 %
Eosinophils Absolute: 0.1 10*3/uL (ref 0.0–0.5)
Eosinophils Relative: 1 %
HCT: 38.6 % (ref 36.0–46.0)
Hemoglobin: 12.4 g/dL (ref 12.0–15.0)
Immature Granulocytes: 0 %
Lymphocytes Relative: 35 %
Lymphs Abs: 2.5 10*3/uL (ref 0.7–4.0)
MCH: 26.8 pg (ref 26.0–34.0)
MCHC: 32.1 g/dL (ref 30.0–36.0)
MCV: 83.4 fL (ref 80.0–100.0)
Monocytes Absolute: 0.6 10*3/uL (ref 0.1–1.0)
Monocytes Relative: 8 %
Neutro Abs: 3.9 10*3/uL (ref 1.7–7.7)
Neutrophils Relative %: 56 %
Platelets: 194 10*3/uL (ref 150–400)
RBC: 4.63 MIL/uL (ref 3.87–5.11)
RDW: 14.2 % (ref 11.5–15.5)
WBC: 7 10*3/uL (ref 4.0–10.5)
nRBC: 0 % (ref 0.0–0.2)

## 2022-05-22 LAB — BASIC METABOLIC PANEL
Anion gap: 9 (ref 5–15)
BUN: 18 mg/dL (ref 6–20)
CO2: 23 mmol/L (ref 22–32)
Calcium: 8.4 mg/dL — ABNORMAL LOW (ref 8.9–10.3)
Chloride: 109 mmol/L (ref 98–111)
Creatinine, Ser: 0.88 mg/dL (ref 0.44–1.00)
GFR, Estimated: >60 mL/min (ref >60)
Glucose, Bld: 88 mg/dL (ref 70–99)
Potassium: 3.7 mmol/L (ref 3.5–5.1)
Sodium: 141 mmol/L (ref 135–145)

## 2022-05-22 LAB — LIPID PANEL
Cholesterol: 197 mg/dL (ref 0–200)
HDL: 76 mg/dL (ref >40)
LDL Cholesterol: 93 mg/dL (ref 0–99)
Total CHOL/HDL Ratio: 2.6 RATIO
Triglycerides: 140 mg/dL (ref <150)
VLDL: 28 mg/dL (ref 0–40)

## 2022-05-22 LAB — VITAMIN D 25 HYDROXY (VIT D DEFICIENCY, FRACTURES): Vit D, 25-Hydroxy: 13.93 ng/mL — ABNORMAL LOW (ref 30–100)

## 2022-06-30 IMAGING — DX DG FOOT COMPLETE 3+V*L*
3 series · 3 of 3 positions shown · non-contrast
Comparison: None.

CLINICAL DATA: Left foot pain.  Lateral pain for 2-3 years.

EXAM:
LEFT FOOT - COMPLETE 3+ VIEW

[foot ap]
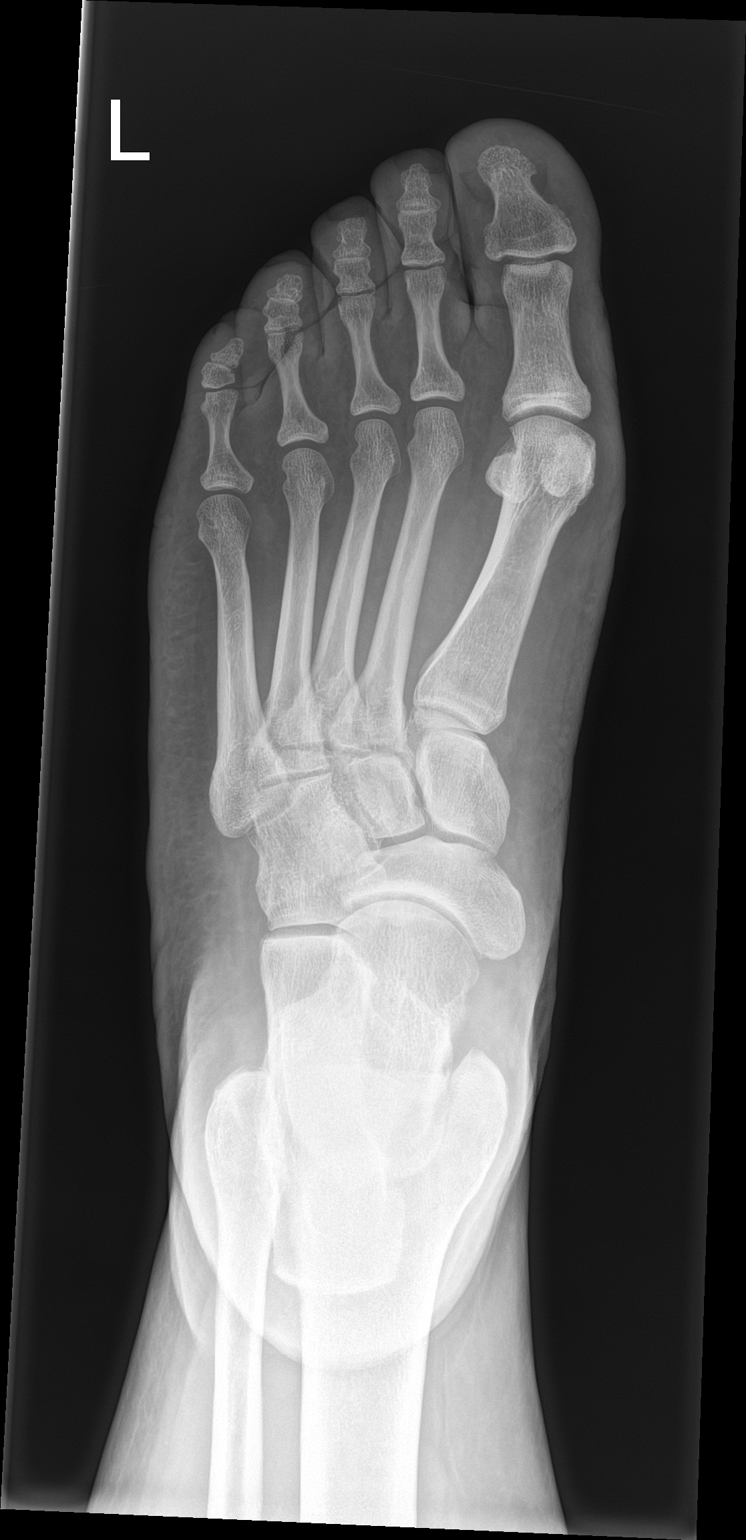

[foot obl]
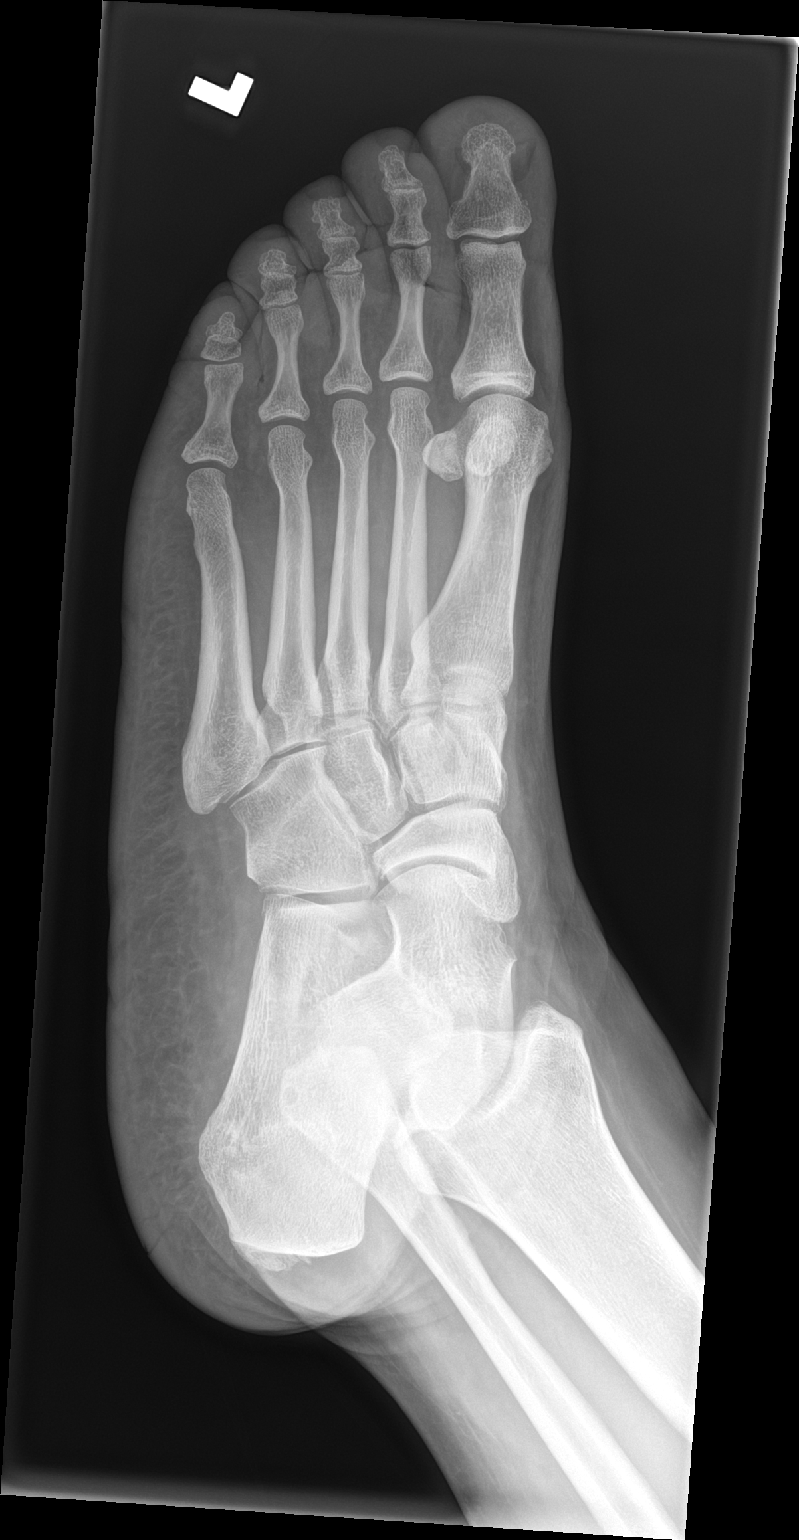

[foot lat]
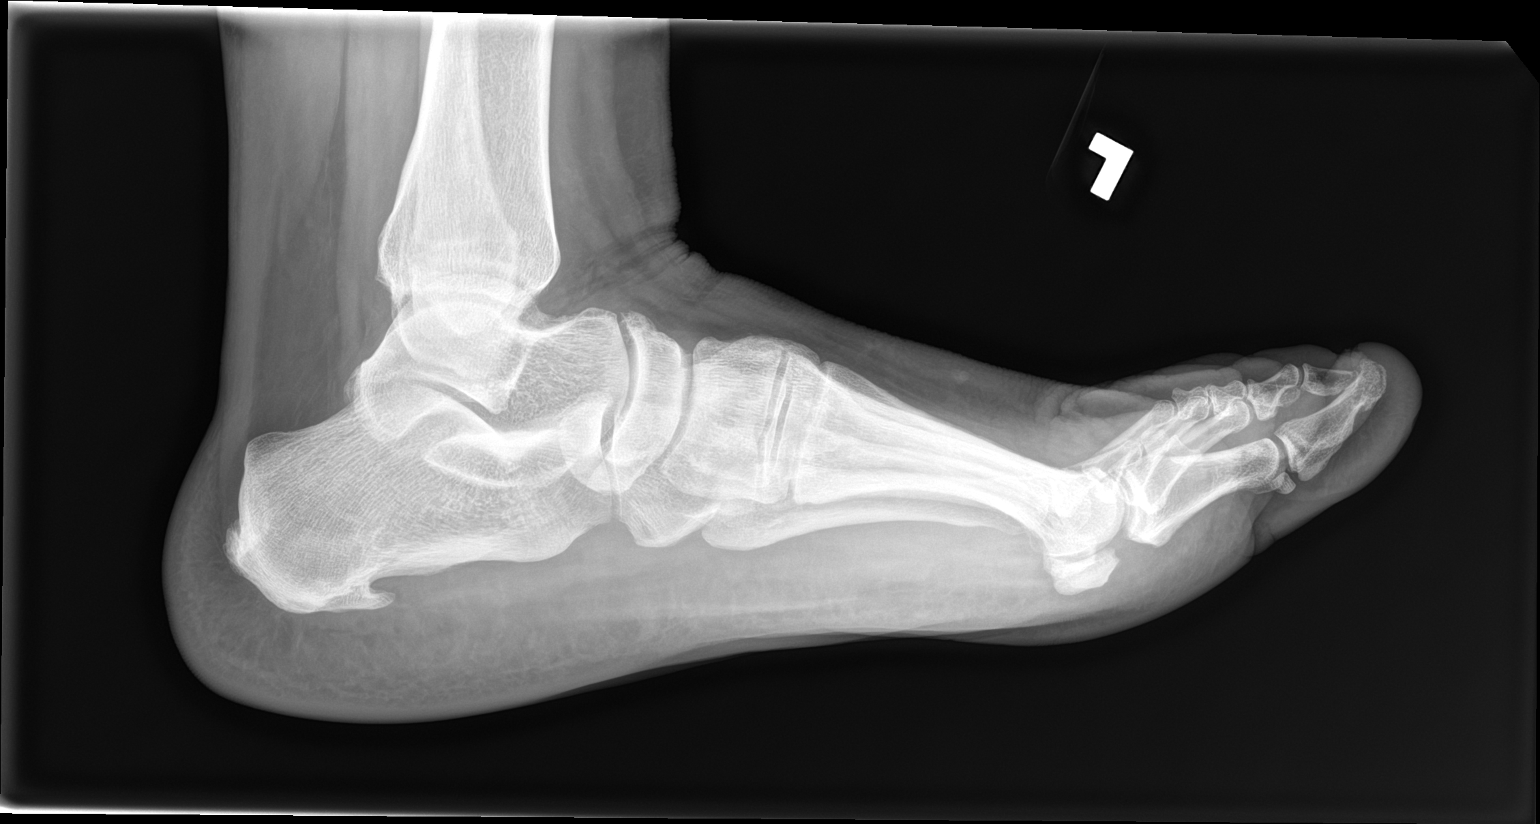

[3 of 3 positions shown; findings below may reference images not displayed]

FINDINGS: Mild pes planus. Mild dorsal talonavicular degenerative
osteophytosis and joint space narrowing. Moderate calcaneal heel
spur. Minimal chronic enthesopathic change at the Achilles insertion
on the calcaneus. Minimal lateral great toe metatarsal head
degenerative spurring. Minimal hallux valgus. No acute fracture or
dislocation.
IMPRESSION: :
IMPRESSION: 1. Moderate calcaneal heel spur.
2. Mild talonavicular osteoarthritis.
3. Mild pes planus.

## 2023-02-07 ENCOUNTER — Other Ambulatory Visit (HOSPITAL_COMMUNITY)
Admission: RE | Admit: 2023-02-07 | Discharge: 2023-02-07 | Disposition: A | Discharge status: Home / Self Care | Payer: Managed Care, Other (non HMO) | Source: Ambulatory Visit | Attending: Internal Medicine | Admitting: Internal Medicine

## 2023-02-07 DIAGNOSIS — I10 Essential (primary) hypertension: Secondary | ICD-10-CM | POA: Diagnosis present

## 2023-02-07 DIAGNOSIS — E559 Vitamin D deficiency, unspecified: Secondary | ICD-10-CM | POA: Insufficient documentation

## 2023-02-07 DIAGNOSIS — K219 Gastro-esophageal reflux disease without esophagitis: Secondary | ICD-10-CM | POA: Diagnosis not present

## 2023-02-07 LAB — CBC WITH DIFFERENTIAL/PLATELET
Abs Immature Granulocytes: 0.01 K/uL (ref 0.00–0.07)
Basophils Absolute: 0 K/uL (ref 0.0–0.1)
Basophils Relative: 1 %
Eosinophils Absolute: 0.1 K/uL (ref 0.0–0.5)
Eosinophils Relative: 3 %
HCT: 42.7 % (ref 36.0–46.0)
Hemoglobin: 13.5 g/dL (ref 12.0–15.0)
Immature Granulocytes: 0 %
Lymphocytes Relative: 46 %
Lymphs Abs: 1.8 K/uL (ref 0.7–4.0)
MCH: 26.5 pg (ref 26.0–34.0)
MCHC: 31.6 g/dL (ref 30.0–36.0)
MCV: 83.9 fL (ref 80.0–100.0)
Monocytes Absolute: 0.6 K/uL (ref 0.1–1.0)
Monocytes Relative: 14 %
Neutro Abs: 1.4 K/uL — ABNORMAL LOW (ref 1.7–7.7)
Neutrophils Relative %: 36 %
Platelets: 172 K/uL (ref 150–400)
RBC: 5.09 MIL/uL (ref 3.87–5.11)
RDW: 13.2 % (ref 11.5–15.5)
WBC: 4 K/uL (ref 4.0–10.5)
nRBC: 0 % (ref 0.0–0.2)

## 2023-02-07 LAB — BASIC METABOLIC PANEL
Anion gap: 8 (ref 5–15)
BUN: 17 mg/dL (ref 6–20)
CO2: 25 mmol/L (ref 22–32)
Calcium: 8.9 mg/dL (ref 8.9–10.3)
Chloride: 104 mmol/L (ref 98–111)
Creatinine, Ser: 0.63 mg/dL (ref 0.44–1.00)
GFR, Estimated: >60 mL/min (ref >60)
Glucose, Bld: 93 mg/dL (ref 70–99)
Potassium: 4 mmol/L (ref 3.5–5.1)
Sodium: 137 mmol/L (ref 135–145)

## 2023-02-07 LAB — LIPID PANEL
Cholesterol: 192 mg/dL (ref 0–200)
HDL: 74 mg/dL (ref >40)
LDL Cholesterol: 104 mg/dL — ABNORMAL HIGH (ref 0–99)
Total CHOL/HDL Ratio: 2.6 {ratio}
Triglycerides: 68 mg/dL (ref <150)
VLDL: 14 mg/dL (ref 0–40)

## 2023-02-07 LAB — HEPATIC FUNCTION PANEL
ALT: 19 U/L (ref 0–44)
AST: 19 U/L (ref 15–41)
Albumin: 4.1 g/dL (ref 3.5–5.0)
Alkaline Phosphatase: 76 U/L (ref 38–126)
Bilirubin, Direct: 0.1 mg/dL (ref 0.0–0.2)
Indirect Bilirubin: 0.4 mg/dL (ref 0.3–0.9)
Total Bilirubin: 0.5 mg/dL (ref <1.2)
Total Protein: 7.4 g/dL (ref 6.5–8.1)

## 2023-02-08 LAB — CALCITRIOL (1,25 DI-OH VIT D): Vit D, 1,25-Dihydroxy: 44.7 pg/mL (ref 24.8–81.5)

## 2023-02-14 ENCOUNTER — Other Ambulatory Visit (HOSPITAL_COMMUNITY): Payer: Self-pay | Admitting: Internal Medicine

## 2023-02-14 DIAGNOSIS — Z1231 Encounter for screening mammogram for malignant neoplasm of breast: Secondary | ICD-10-CM

## 2023-02-20 ENCOUNTER — Other Ambulatory Visit (HOSPITAL_COMMUNITY): Payer: Self-pay | Admitting: Internal Medicine

## 2023-02-20 DIAGNOSIS — N6489 Other specified disorders of breast: Secondary | ICD-10-CM

## 2023-02-28 ENCOUNTER — Ambulatory Visit (HOSPITAL_COMMUNITY): Payer: Managed Care, Other (non HMO)

## 2023-04-03 ENCOUNTER — Encounter (HOSPITAL_COMMUNITY): Payer: Self-pay

## 2023-04-03 ENCOUNTER — Ambulatory Visit (HOSPITAL_COMMUNITY): Payer: Managed Care, Other (non HMO) | Attending: Internal Medicine

## 2023-04-03 ENCOUNTER — Inpatient Hospital Stay (HOSPITAL_COMMUNITY): Admission: RE | Admit: 2023-04-03 | Payer: Managed Care, Other (non HMO) | Source: Ambulatory Visit

## 2023-04-03 ENCOUNTER — Ambulatory Visit (HOSPITAL_COMMUNITY): Admission: RE | Admit: 2023-04-03 | Payer: Managed Care, Other (non HMO) | Source: Ambulatory Visit

## 2024-04-01 ENCOUNTER — Other Ambulatory Visit (HOSPITAL_COMMUNITY): Payer: Self-pay | Admitting: Internal Medicine

## 2024-04-01 ENCOUNTER — Other Ambulatory Visit (HOSPITAL_COMMUNITY)
Admission: RE | Admit: 2024-04-01 | Discharge: 2024-04-01 | Disposition: A | Discharge status: Home / Self Care | Source: Ambulatory Visit | Attending: Internal Medicine | Admitting: Internal Medicine

## 2024-04-01 DIAGNOSIS — N6489 Other specified disorders of breast: Secondary | ICD-10-CM

## 2024-04-01 DIAGNOSIS — I10 Essential (primary) hypertension: Secondary | ICD-10-CM | POA: Diagnosis present

## 2024-04-01 LAB — CBC WITH DIFFERENTIAL/PLATELET
Abs Immature Granulocytes: 0.01 K/uL (ref 0.00–0.07)
Basophils Absolute: 0 K/uL (ref 0.0–0.1)
Basophils Relative: 0 %
Eosinophils Absolute: 0.1 K/uL (ref 0.0–0.5)
Eosinophils Relative: 2 %
HCT: 41 % (ref 36.0–46.0)
Hemoglobin: 13 g/dL (ref 12.0–15.0)
Immature Granulocytes: 0 %
Lymphocytes Relative: 40 %
Lymphs Abs: 1.9 K/uL (ref 0.7–4.0)
MCH: 26.7 pg (ref 26.0–34.0)
MCHC: 31.7 g/dL (ref 30.0–36.0)
MCV: 84.4 fL (ref 80.0–100.0)
Monocytes Absolute: 0.5 K/uL (ref 0.1–1.0)
Monocytes Relative: 11 %
Neutro Abs: 2.3 K/uL (ref 1.7–7.7)
Neutrophils Relative %: 47 %
Platelets: 178 K/uL (ref 150–400)
RBC: 4.86 MIL/uL (ref 3.87–5.11)
RDW: 14.5 % (ref 11.5–15.5)
WBC: 4.9 K/uL (ref 4.0–10.5)
nRBC: 0 % (ref 0.0–0.2)

## 2024-04-01 LAB — BASIC METABOLIC PANEL WITH GFR
Anion gap: 8 (ref 5–15)
BUN: 14 mg/dL (ref 6–20)
CO2: 26 mmol/L (ref 22–32)
Calcium: 8.8 mg/dL — ABNORMAL LOW (ref 8.9–10.3)
Chloride: 106 mmol/L (ref 98–111)
Creatinine, Ser: 0.73 mg/dL (ref 0.44–1.00)
GFR, Estimated: >60 mL/min
Glucose, Bld: 91 mg/dL (ref 70–99)
Potassium: 4.1 mmol/L (ref 3.5–5.1)
Sodium: 140 mmol/L (ref 135–145)

## 2024-04-01 LAB — LIPID PANEL
Cholesterol: 193 mg/dL (ref 0–200)
HDL: 58 mg/dL
LDL Cholesterol: 117 mg/dL — ABNORMAL HIGH (ref 0–99)
Total CHOL/HDL Ratio: 3.3 ratio
Triglycerides: 89 mg/dL
VLDL: 18 mg/dL (ref 0–40)

## 2024-04-01 LAB — HEPATIC FUNCTION PANEL
ALT: 14 U/L (ref 0–44)
AST: 18 U/L (ref 15–41)
Albumin: 4.2 g/dL (ref 3.5–5.0)
Alkaline Phosphatase: 98 U/L (ref 38–126)
Bilirubin, Direct: 0.2 mg/dL (ref 0.0–0.2)
Indirect Bilirubin: 0.3 mg/dL (ref 0.3–0.9)
Total Bilirubin: 0.5 mg/dL (ref 0.0–1.2)
Total Protein: 6.7 g/dL (ref 6.5–8.1)

## 2024-04-01 LAB — HEMOGLOBIN A1C
Hgb A1c MFr Bld: 5.8 % — ABNORMAL HIGH (ref 4.8–5.6)
Mean Plasma Glucose: 119.76 mg/dL

## 2024-04-02 LAB — CALCITRIOL (1,25 DI-OH VIT D): Vit D, 1,25-Dihydroxy: 61.3 pg/mL (ref 24.8–81.5)

## 2024-05-06 ENCOUNTER — Ambulatory Visit (HOSPITAL_COMMUNITY)

## 2024-05-06 ENCOUNTER — Encounter (HOSPITAL_COMMUNITY)
